# Patient Record
Sex: Male | Born: 1995 | Race: Black or African American | Hispanic: No | Marital: Married | Smoking: Never smoker
Health system: Southern US, Community
[De-identification: ages and names within clinical notes are randomized; demographics above are authoritative.]

## PROBLEM LIST (undated history)

## (undated) DIAGNOSIS — J45909 Unspecified asthma, uncomplicated: Secondary | ICD-10-CM

## (undated) HISTORY — PX: TESTICLE SURGERY: SHX794

## (undated) HISTORY — DX: Morbid (severe) obesity due to excess calories: E66.01

---

## 2020-01-17 ENCOUNTER — Other Ambulatory Visit: Payer: Self-pay

## 2020-01-17 ENCOUNTER — Encounter: Payer: Self-pay | Admitting: Emergency Medicine

## 2020-01-17 ENCOUNTER — Emergency Department
Admission: EM | Admit: 2020-01-17 | Discharge: 2020-01-17 | Disposition: A | Discharge status: Home / Self Care | Payer: Medicaid Other | Attending: Emergency Medicine | Admitting: Emergency Medicine

## 2020-01-17 DIAGNOSIS — R300 Dysuria: Secondary | ICD-10-CM | POA: Diagnosis not present

## 2020-01-17 DIAGNOSIS — Z202 Contact with and (suspected) exposure to infections with a predominantly sexual mode of transmission: Secondary | ICD-10-CM | POA: Diagnosis present

## 2020-01-17 LAB — URINALYSIS, COMPLETE (UACMP) WITH MICROSCOPIC
Bacteria, UA: NONE SEEN
Bilirubin Urine: NEGATIVE
Glucose, UA: NEGATIVE mg/dL
Hgb urine dipstick: NEGATIVE
Ketones, ur: NEGATIVE mg/dL
Leukocytes,Ua: NEGATIVE
Nitrite: NEGATIVE
Protein, ur: NEGATIVE mg/dL
Specific Gravity, Urine: 1.024 (ref 1.005–1.030)
Squamous Epithelial / HPF: NONE SEEN (ref 0–5)
pH: 6 (ref 5.0–8.0)

## 2020-01-17 LAB — CHLAMYDIA/NGC RT PCR (ARMC ONLY)
Chlamydia Tr: NOT DETECTED
N gonorrhoeae: DETECTED — AB

## 2020-01-17 MED ORDER — LIDOCAINE HCL (PF) 1 % IJ SOLN
1.2000 mL | Freq: Once | INTRAMUSCULAR | Status: AC
  Administered 2020-01-17: 1.2 mL
  Filled 2020-01-17: qty 5

## 2020-01-17 MED ORDER — CEFTRIAXONE SODIUM 1 G IJ SOLR
500.0000 mg | Freq: Once | INTRAMUSCULAR | Status: AC
  Administered 2020-01-17: 500 mg via INTRAMUSCULAR
  Filled 2020-01-17: qty 10

## 2020-01-17 MED ORDER — AZITHROMYCIN 500 MG PO TABS
1000.0000 mg | ORAL_TABLET | Freq: Every day | ORAL | Status: DC
  Administered 2020-01-17: 1000 mg via ORAL
  Filled 2020-01-17: qty 2

## 2020-01-17 NOTE — ED Notes (Signed)
See triage note  Presents with burning with urination  States sxs' started couple of weeks ago  No discharge

## 2020-01-17 NOTE — ED Triage Notes (Signed)
Here for burning in urethra.  Possible std exposure.  No discharge. Sx X 2-3 weeks.

## 2020-01-17 NOTE — ED Provider Notes (Signed)
[facility] Emergency Department Provider Note  ____________________________________________  Time seen: Approximately 2:55 PM  I have reviewed the triage vital signs and the nursing notes.   HISTORY  Chief Complaint SEXUALLY TRANSMITTED DISEASE    HPI Deyvi Frangos is a 24 y.o. male after STD exposure.  He is having some burning in the urethra and with urination.  Symptoms started a couple weeks ago.  He denies fever or abdominal pain.   History reviewed. No pertinent past medical history.  There are no problems to display for this patient.   History reviewed. No pertinent surgical history.  Prior to Admission medications   Not on File    Allergies Patient has no known allergies.  History reviewed. No pertinent family history.  Social History Social History   Tobacco Use  . Smoking status: Never Smoker  . Smokeless tobacco: Never Used  Substance Use Topics  . Alcohol use: Never  . Drug use: Never    Review of Systems Constitutional: Negative for fever. Respiratory: Negative for shortness of breath or cough. Gastrointestinal: Negative for abdominal pain; negative for nausea , negative for vomiting. Genitourinary: Positive for dysuria , negative for penile discharge. Musculoskeletal: Negative for back pain. Skin: Negative for acute skin changes/rash/lesion. ____________________________________________   PHYSICAL EXAM:  VITAL SIGNS: ED Triage Vitals  Enc Vitals Group     BP 01/17/20 1245 135/74     Pulse Rate 01/17/20 1245 71     Resp 01/17/20 1245 16     Temp 01/17/20 1245 98.9 F (37.2 C)     Temp Source 01/17/20 1245 Oral     SpO2 01/17/20 1245 97 %     Weight 01/17/20 1239 230 lb (104.3 kg)     Height 01/17/20 1239 5\' 7"  (1.702 m)     Head Circumference --      Peak Flow --      Pain Score 01/17/20 1238 10     Pain Loc --      Pain Edu? --      Excl. in GC? --     Constitutional: Alert and oriented. Well  appearing and in no acute distress. Eyes: Conjunctivae are normal. Head: Atraumatic. Nose: No congestion/rhinnorhea. Mouth/Throat: Mucous membranes are moist. Respiratory: Normal respiratory effort.  No retractions. Gastrointestinal: Bowel sounds active x 4; Abdomen is soft without rebound or guarding. Genitourinary: Visual exam deferred Musculoskeletal: No extremity tenderness nor edema.  Neurologic:  Normal speech and language. No gross focal neurologic deficits are appreciated. Speech is normal. No gait instability. Skin:  Skin is warm, dry and intact. No rash noted on exposed skin. Psychiatric: Mood and affect are normal. Speech and behavior are normal.  ____________________________________________   LABS (all labs ordered are listed, but only abnormal results are displayed)  Labs Reviewed  CHLAMYDIA/NGC RT PCR ([facility] ONLY) - Abnormal; Notable for the following components:      Result Value   N gonorrhoeae DETECTED (*)    All other components within normal limits  URINALYSIS, COMPLETE (UACMP) WITH MICROSCOPIC - Abnormal; Notable for the following components:   Color, Urine YELLOW (*)    APPearance CLEAR (*)    All other components within normal limits   ____________________________________________  RADIOLOGY  Not indicated ____________________________________________  Procedures  ____________________________________________  24 year old male presenting to the emergency department for treatment of STD.  He is unsure which STD he has been in contact with, however he states that he has had STD in the past and has same symptoms  today.  See HPI for further details.  Urinalysis is overall reassuring with the exception of some white blood cells.  Chlamydia and gonorrhea testing is pending.  He will be treated empirically with Rocephin and azithromycin.  He will be discharged home and advised to follow-up with the [place] for further or repeat  STD testing.  He was advised to avoid intercourse for the next week.  He is to return to the emergency department for symptoms of change or worsen if unable to schedule appointment.  INITIAL IMPRESSION / ASSESSMENT AND PLAN / ED COURSE  Pertinent labs & imaging results that were available during my care of the patient were reviewed by me and considered in my medical decision making (see chart for details).  ____________________________________________   FINAL CLINICAL IMPRESSION(S) / ED DIAGNOSES  Final diagnoses:  STD exposure    Note:  This document was prepared using Dragon voice recognition software and may include unintentional dictation errors.   Victorino Dike, FNP 01/17/20 1557    Harvest Dark, MD 01/18/20 1156

## 2020-01-20 ENCOUNTER — Telehealth: Payer: Self-pay | Admitting: Emergency Medicine

## 2020-01-20 NOTE — Telephone Encounter (Signed)
Called patient to inform of std test results.  He was treated during ED visit.  No answer. No voicemail.

## 2020-07-31 ENCOUNTER — Encounter: Payer: Self-pay | Admitting: *Deleted

## 2020-07-31 ENCOUNTER — Emergency Department
Admission: EM | Admit: 2020-07-31 | Discharge: 2020-07-31 | Disposition: A | Discharge status: Home / Self Care | Payer: Medicaid Other | Attending: Emergency Medicine | Admitting: Emergency Medicine

## 2020-07-31 ENCOUNTER — Other Ambulatory Visit: Payer: Self-pay

## 2020-07-31 DIAGNOSIS — H9203 Otalgia, bilateral: Secondary | ICD-10-CM | POA: Diagnosis not present

## 2020-07-31 DIAGNOSIS — Z202 Contact with and (suspected) exposure to infections with a predominantly sexual mode of transmission: Secondary | ICD-10-CM | POA: Diagnosis not present

## 2020-07-31 LAB — URINALYSIS, COMPLETE (UACMP) WITH MICROSCOPIC
Bacteria, UA: NONE SEEN
Bilirubin Urine: NEGATIVE
Glucose, UA: NEGATIVE mg/dL
Hgb urine dipstick: NEGATIVE
Ketones, ur: NEGATIVE mg/dL
Nitrite: NEGATIVE
Protein, ur: NEGATIVE mg/dL
Specific Gravity, Urine: 1.028 (ref 1.005–1.030)
WBC, UA: >50 WBC/hpf — ABNORMAL HIGH (ref 0–5)
pH: 6 (ref 5.0–8.0)

## 2020-07-31 MED ORDER — CETIRIZINE HCL 10 MG PO TABS
10.0000 mg | ORAL_TABLET | Freq: Every day | ORAL | 2 refills | Status: DC
Start: 2020-07-31 — End: 2020-10-31

## 2020-07-31 MED ORDER — DOXYCYCLINE HYCLATE 100 MG PO TBEC: 100.0000 mg | DELAYED_RELEASE_TABLET | Freq: Two times a day (BID) | ORAL | 0 refills | Status: AC

## 2020-07-31 MED ORDER — CEFTRIAXONE SODIUM 1 G IJ SOLR
500.0000 mg | Freq: Once | INTRAMUSCULAR | Status: AC
  Administered 2020-07-31: 500 mg via INTRAMUSCULAR
  Filled 2020-07-31: qty 10

## 2020-07-31 MED ORDER — LIDOCAINE HCL (PF) 1 % IJ SOLN
2.0000 mL | Freq: Once | INTRAMUSCULAR | Status: AC
  Administered 2020-07-31: 2 mL
  Filled 2020-07-31: qty 5

## 2020-07-31 MED ORDER — METRONIDAZOLE 500 MG PO TABS
2000.0000 mg | ORAL_TABLET | Freq: Once | ORAL | Status: AC
  Administered 2020-07-31: 2000 mg via ORAL
  Filled 2020-07-31: qty 4

## 2020-07-31 MED ORDER — FLUTICASONE PROPIONATE 50 MCG/ACT NA SUSP
1.0000 | Freq: Every day | NASAL | 2 refills | Status: DC
Start: 2020-07-31 — End: 2020-10-31

## 2020-07-31 NOTE — ED Provider Notes (Signed)
Emergency Department Provider Note  ____________________________________________  Time seen: Approximately 10:55 PM  I have reviewed the triage vital signs and the nursing notes.   HISTORY  Chief Complaint Otalgia and Dysuria   Historian Patient     HPI Jose Rosales is a 24 y.o. male presents to the emergency department with bilateral ear pain and concern for possible STDs.  Patient states that he does not have dysuria but he has a sensation of pain when he stops urinating.  He denies penile discharge or rash.  States that he is sexually active and has unprotected sex with only 1 person.  Denies fever and chills.  No associated rhinorrhea, nasal congestion or nonproductive cough.  Denies low back pain, nausea or vomiting.   No past medical history on file.   Immunizations up to date:  Yes.     No past medical history on file.  There are no problems to display for this patient.   No past surgical history on file.  Prior to Admission medications   Medication Sig Start Date End Date Taking? Authorizing Provider  cetirizine (ZYRTEC ALLERGY) 10 MG tablet Take 1 tablet (10 mg total) by mouth daily. 07/31/20 07/31/21  Orvil Feil, PA-C  doxycycline (DORYX) 100 MG EC tablet Take 1 tablet (100 mg total) by mouth 2 (two) times daily for 7 days. 07/31/20 08/07/20  Orvil Feil, PA-C  fluticasone (FLONASE) 50 MCG/ACT nasal spray Place 1 spray into both nostrils daily. 07/31/20 07/31/21  Orvil Feil, PA-C    Allergies Patient has no known allergies.  No family history on file.  Social History Social History   Tobacco Use  . Smoking status: Never Smoker  . Smokeless tobacco: Never Used  Substance Use Topics  . Alcohol use: Never  . Drug use: Never     Review of Systems  Constitutional: No fever/chills Eyes:  No discharge ENT: Patient has bilateral ear pain.  Respiratory: no cough. No SOB/ use of accessory muscles to breath Gastrointestinal:   No nausea,  no vomiting.  No diarrhea.  No constipation. Musculoskeletal: Negative for musculoskeletal pain. Skin: Negative for rash, abrasions, lacerations, ecchymosis.    ____________________________________________   PHYSICAL EXAM:  VITAL SIGNS: ED Triage Vitals  Enc Vitals Group     BP 07/31/20 2127 140/73     Pulse Rate 07/31/20 2127 79     Resp 07/31/20 2127 18     Temp 07/31/20 2127 99.1 F (37.3 C)     Temp Source 07/31/20 2127 Oral     SpO2 07/31/20 2127 96 %     Weight 07/31/20 2123 230 lb (104.3 kg)     Height 07/31/20 2123 5\' 10"  (1.778 m)     Head Circumference --      Peak Flow --      Pain Score 07/31/20 2123 9     Pain Loc --      Pain Edu? --      Excl. in GC? --      Constitutional: Alert and oriented. Well appearing and in no acute distress. Eyes: Conjunctivae are normal. PERRL. EOMI. Head: Atraumatic. ENT:      Ears: TMs are effused bilaterally.      Nose: No congestion/rhinnorhea.      Mouth/Throat: Mucous membranes are moist.  Neck: No stridor.  No cervical spine tenderness to palpation. Cardiovascular: Normal rate, regular rhythm. Normal S1 and S2.  Good peripheral circulation. Respiratory: Normal respiratory effort without tachypnea or retractions. Lungs CTAB. Good  air entry to the bases with no decreased or absent breath sounds Gastrointestinal: Bowel sounds x 4 quadrants. Soft and nontender to palpation. No guarding or rigidity. No distention. No CVA tenderness.  Musculoskeletal: Full range of motion to all extremities. No obvious deformities noted Neurologic:  Normal for age. No gross focal neurologic deficits are appreciated.  Skin:  Skin is warm, dry and intact. No rash noted. Psychiatric: Mood and affect are normal for age. Speech and behavior are normal.   ____________________________________________   LABS (all labs ordered are listed, but only abnormal results are displayed)  Labs Reviewed  URINALYSIS, COMPLETE (UACMP) WITH MICROSCOPIC -  Abnormal; Notable for the following components:      Result Value   Color, Urine YELLOW (*)    APPearance CLEAR (*)    Leukocytes,Ua SMALL (*)    WBC, UA >50 (*)    All other components within normal limits  CHLAMYDIA/NGC RT PCR ([facility] ONLY)   ____________________________________________  EKG   ____________________________________________  RADIOLOGY   No results found.  ____________________________________________    PROCEDURES  Procedure(s) performed:     Procedures     Medications  cefTRIAXone (ROCEPHIN) injection 500 mg (500 mg Intramuscular Given 07/31/20 2241)  metroNIDAZOLE (FLAGYL) tablet 2,000 mg (2,000 mg Oral Given 07/31/20 2242)  lidocaine (PF) (XYLOCAINE) 1 % injection 2 mL (2 mLs Other Given 07/31/20 2242)     ____________________________________________   INITIAL IMPRESSION / ASSESSMENT AND PLAN / ED COURSE  Pertinent labs & imaging results that were available during my care of the patient were reviewed by me and considered in my medical decision making (see chart for details).      Assessment and plan Ear pain STD exposure 24 year old male presents to the emergency department with bilateral ear pain and concern for STD exposure  Vital signs are reassuring at triage.  On physical exam, patient had a middle ear effusion bilaterally.  He had no suprapubic tenderness or CVA tenderness on exam.  We will treat with Flonase and Zyrtec for likely eustachian tube dysfunction.  Patient requested to be empirically treated for gonorrhea, chlamydia and trichomoniasis.  He received Rocephin and Flagyl in the emergency department.  He was discharged with doxycycline.  Return precautions were given to return with new or worsening symptoms.    ____________________________________________  FINAL CLINICAL IMPRESSION(S) / ED DIAGNOSES  Final diagnoses:  STD exposure  Otalgia of both ears      NEW MEDICATIONS STARTED DURING THIS VISIT:  ED  Discharge Orders         Ordered    fluticasone (FLONASE) 50 MCG/ACT nasal spray  Daily        07/31/20 2246    cetirizine (ZYRTEC ALLERGY) 10 MG tablet  Daily        07/31/20 2246    doxycycline (DORYX) 100 MG EC tablet  2 times daily        07/31/20 2246              This chart was dictated using voice recognition software/Dragon. Despite best efforts to proofread, errors can occur which can change the meaning. Any change was purely unintentional.     Gasper Lloyd 07/31/20 2300    Chesley Noon, MD 07/31/20 2336

## 2020-07-31 NOTE — ED Triage Notes (Signed)
Pt has bilateral earache and dysuria.  Sx for 6 days. No penile discharge.  Pt alert.

## 2020-07-31 NOTE — Discharge Instructions (Signed)
Take Flonase and Zyrtec for eustachian tube dysfunction. Take doxycycline twice daily for the next 7 days. Please abstain from unprotected sex for the next 2 weeks.

## 2020-07-31 NOTE — ED Notes (Signed)
Pt states ear pain began approx 1 week ago on the right side and has now progressed to the left. Pain described and an uncomfortable pressure. He is worried that a "red fly" may have crawled into his right ear just before Sx began. Urinary Sx began 3 days ago and include irritation with urination. Pt is sexually active but denies any discharge. Pt denies F/C, NVD.

## 2020-07-31 NOTE — ED Notes (Signed)
Pt unable to void at this time. 

## 2020-08-01 LAB — CHLAMYDIA/NGC RT PCR (ARMC ONLY)
Chlamydia Tr: DETECTED — AB
N gonorrhoeae: DETECTED — AB

## 2020-08-02 ENCOUNTER — Telehealth: Payer: Self-pay | Admitting: Emergency Medicine

## 2020-08-02 NOTE — Telephone Encounter (Signed)
Called patient to inform of std test results.  Left message asking him to call me.

## 2020-08-03 NOTE — Telephone Encounter (Signed)
Called patient again to notify of std results.  No answer.  Will send letter.

## 2020-08-23 DIAGNOSIS — U071 COVID-19: Secondary | ICD-10-CM | POA: Insufficient documentation

## 2020-08-23 HISTORY — DX: COVID-19: U07.1

## 2020-09-14 ENCOUNTER — Emergency Department: Payer: Medicaid Other

## 2020-09-14 ENCOUNTER — Other Ambulatory Visit: Payer: Self-pay

## 2020-09-14 ENCOUNTER — Emergency Department
Admission: EM | Admit: 2020-09-14 | Discharge: 2020-09-14 | Disposition: A | Discharge status: Home / Self Care | Payer: Medicaid Other | Attending: Emergency Medicine | Admitting: Emergency Medicine

## 2020-09-14 ENCOUNTER — Encounter: Payer: Self-pay | Admitting: Emergency Medicine

## 2020-09-14 DIAGNOSIS — R519 Headache, unspecified: Secondary | ICD-10-CM

## 2020-09-14 DIAGNOSIS — U071 COVID-19: Secondary | ICD-10-CM | POA: Insufficient documentation

## 2020-09-14 DIAGNOSIS — R42 Dizziness and giddiness: Secondary | ICD-10-CM | POA: Insufficient documentation

## 2020-09-14 DIAGNOSIS — R0981 Nasal congestion: Secondary | ICD-10-CM | POA: Diagnosis not present

## 2020-09-14 LAB — RESP PANEL BY RT-PCR (FLU A&B, COVID) ARPGX2
Influenza A by PCR: NEGATIVE
Influenza B by PCR: NEGATIVE
SARS Coronavirus 2 by RT PCR: POSITIVE — AB

## 2020-09-14 MED ORDER — ONDANSETRON HCL 8 MG PO TABS
8.0000 mg | ORAL_TABLET | Freq: Three times a day (TID) | ORAL | 0 refills | Status: DC | PRN
Start: 2020-09-14 — End: 2020-10-31

## 2020-09-14 MED ORDER — PSEUDOEPH-BROMPHEN-DM 30-2-10 MG/5ML PO SYRP
5.0000 mL | ORAL_SOLUTION | Freq: Four times a day (QID) | ORAL | 0 refills | Status: DC | PRN
Start: 2020-09-14 — End: 2020-10-31

## 2020-09-14 MED ORDER — PSEUDOEPH-BROMPHEN-DM 30-2-10 MG/5ML PO SYRP
5.0000 mL | ORAL_SOLUTION | Freq: Four times a day (QID) | ORAL | 0 refills | Status: DC | PRN
Start: 2020-09-14 — End: 2020-09-14

## 2020-09-14 MED ORDER — IBUPROFEN 600 MG PO TABS: 600.0000 mg | ORAL_TABLET | Freq: Three times a day (TID) | ORAL | 0 refills | Status: DC | PRN

## 2020-09-14 MED ORDER — ONDANSETRON HCL 8 MG PO TABS
8.0000 mg | ORAL_TABLET | Freq: Three times a day (TID) | ORAL | 0 refills | Status: DC | PRN
Start: 2020-09-14 — End: 2020-09-14

## 2020-09-14 NOTE — ED Provider Notes (Signed)
[facility] Emergency Department Provider Note   ____________________________________________   Event Date/Time   First MD Initiated Contact with Patient 09/14/20 1136     (approximate)  I have reviewed the triage vital signs and the nursing notes.   HISTORY  Chief Complaint Headache, Fever, and Generalized Body Aches    HPI Jose Rosales is a 24 y.o. male patient presents with frontal headache, mild vertigo, fever, and body aches for 3 days.  Patient states worst headache ever head is not being relieved with ibuprofen or Tylenol.  Patient denies recent travel or known contact with COVID-19.  Patient not taken Covid vaccine or flu shot for the season.  Patient family members here with similar complaints.         History reviewed. No pertinent past medical history.  There are no problems to display for this patient.   History reviewed. No pertinent surgical history.  Prior to Admission medications   Medication Sig Start Date End Date Taking? Authorizing Provider  brompheniramine-pseudoephedrine-DM 30-2-10 MG/5ML syrup Take 5 mLs by mouth 4 (four) times daily as needed. 09/14/20   Joni Reining, PA-C  cetirizine (ZYRTEC ALLERGY) 10 MG tablet Take 1 tablet (10 mg total) by mouth daily. 07/31/20 07/31/21  Orvil Feil, PA-C  fluticasone (FLONASE) 50 MCG/ACT nasal spray Place 1 spray into both nostrils daily. 07/31/20 07/31/21  Orvil Feil, PA-C  ibuprofen (ADVIL) 600 MG tablet Take 1 tablet (600 mg total) by mouth every 8 (eight) hours as needed. 09/14/20   Joni Reining, PA-C  ondansetron (ZOFRAN) 8 MG tablet Take 1 tablet (8 mg total) by mouth every 8 (eight) hours as needed for nausea or vomiting. 09/14/20   Joni Reining, PA-C    Allergies Patient has no known allergies.  No family history on file.  Social History Social History   Tobacco Use  . Smoking status: Never Smoker  . Smokeless tobacco: Never Used  Substance Use  Topics  . Alcohol use: Never  . Drug use: Never    Review of Systems Constitutional: No fever/chills Eyes: No visual changes. ENT: No sore throat. Cardiovascular: Denies chest pain. Respiratory: Denies shortness of breath. Gastrointestinal: No abdominal pain.  No nausea, no vomiting.  No diarrhea.  No constipation. Genitourinary: Negative for dysuria. Musculoskeletal: Negative for back pain. Skin: Negative for rash. Neurological: Positive for headaches, focal weakness or numbness.   ____________________________________________   PHYSICAL EXAM:  VITAL SIGNS: ED Triage Vitals  Enc Vitals Group     BP 09/14/20 0951 129/70     Pulse Rate 09/14/20 0951 93     Resp 09/14/20 0951 20     Temp 09/14/20 0951 99.4 F (37.4 C)     Temp Source 09/14/20 0951 Oral     SpO2 09/14/20 0951 97 %     Weight 09/14/20 0952 235 lb (106.6 kg)     Height 09/14/20 0952 5\' 10"  (1.778 m)     Head Circumference --      Peak Flow --      Pain Score 09/14/20 0952 10     Pain Loc --      Pain Edu? --      Excl. in GC? --     Constitutional: Alert and oriented. Well appearing and in no acute distress. Eyes: Conjunctivae are normal. PERRL. EOMI. Head: Atraumatic. Nose: No congestion/rhinnorhea.  Ethmoid guarding with palpation. Mouth/Throat: Mucous membranes are moist.  Oropharynx non-erythematous.  Postnasal drainage. Neck: No stridor.  Hematological/Lymphatic/Immunilogical: No cervical lymphadenopathy. Cardiovascular: Normal rate, regular rhythm. Grossly normal heart sounds.  Good peripheral circulation. Respiratory: Normal respiratory effort.  No retractions. Lungs CTAB. Gastrointestinal: Soft and nontender. No distention. No abdominal bruits. No CVA tenderness. Neurologic:  Normal speech and language. No gross focal neurologic deficits are appreciated. No gait instability. Skin:  Skin is warm, dry and intact. No rash noted. Psychiatric: Mood and affect are normal. Speech and behavior are  normal.  ____________________________________________   LABS (all labs ordered are listed, but only abnormal results are displayed)  Labs Reviewed  RESP PANEL BY RT-PCR (FLU A&B, COVID) ARPGX2 - Abnormal; Notable for the following components:      Result Value   SARS Coronavirus 2 by RT PCR POSITIVE (*)    All other components within normal limits   ____________________________________________  EKG   ____________________________________________  RADIOLOGY I, Joni Reining, personally viewed and evaluated these images (plain radiographs) as part of my medical decision making, as well as reviewing the written report by the radiologist.  ED MD interpretation:    Official radiology report(s): CT Head Wo Contrast  Result Date: 09/14/2020 CLINICAL DATA:  Cerebral hemorrhage suspected. Additional history provided: Patient reports headache, fever, general body aches, symptoms for 2 days. EXAM: CT HEAD WITHOUT CONTRAST TECHNIQUE: Contiguous axial images were obtained from the base of the skull through the vertex without intravenous contrast. COMPARISON:  No pertinent prior exams available for comparison. FINDINGS: Brain: Cerebral volume is normal. There is no acute intracranial hemorrhage. No demarcated cortical infarct. No extra-axial fluid collection. No evidence of intracranial mass. No midline shift. Vascular: No hyperdense vessel. Skull: Normal. Negative for fracture or focal lesion. Sinuses/Orbits: Visualized orbits show no acute finding. Mild ethmoid sinus mucosal thickening at the imaged levels. IMPRESSION: Unremarkable non-contrast CT appearance of the brain. No evidence of acute intracranial abnormality. Mild ethmoid sinus mucosal thickening. Electronically Signed   By: Jackey Loge DO   On: 09/14/2020 13:50    ____________________________________________   PROCEDURES  Procedure(s) performed (including Critical  Care):  Procedures   ____________________________________________   INITIAL IMPRESSION / ASSESSMENT AND PLAN / ED COURSE  As part of my medical decision making, I reviewed the following data within the electronic MEDICAL RECORD NUMBER         Patient presents with headache, fever, body aches for 2 days.  Patient tested positive for COVID-19 along with siblings and significant other.  Patient given discharge care instruction.  Advised to quarantine for 14 days per [facility] recommendations.  Take medication as directed.     ____________________________________________   FINAL CLINICAL IMPRESSION(S) / ED DIAGNOSES  Final diagnoses:  COVID-19  Sinus congestion  Sinus headache     ED Discharge Orders         Ordered    ibuprofen (ADVIL) 600 MG tablet  Every 8 hours PRN,   Status:  Discontinued        09/14/20 1510    brompheniramine-pseudoephedrine-DM 30-2-10 MG/5ML syrup  4 times daily PRN,   Status:  Discontinued        09/14/20 1510    ondansetron (ZOFRAN) 8 MG tablet  Every 8 hours PRN,   Status:  Discontinued        09/14/20 1510    brompheniramine-pseudoephedrine-DM 30-2-10 MG/5ML syrup  4 times daily PRN        09/14/20 1511    ibuprofen (ADVIL) 600 MG tablet  Every 8 hours PRN        09/14/20 1511  ondansetron (ZOFRAN) 8 MG tablet  Every 8 hours PRN        09/14/20 1511          *Please note:  Desmond Szabo was evaluated in Emergency Department on 09/14/2020 for the symptoms described in the history of present illness. He was evaluated in the context of the global COVID-19 pandemic, which necessitated consideration that the patient might be at risk for infection with the SARS-CoV-2 virus that causes COVID-19. Institutional protocols and algorithms that pertain to the evaluation of patients at risk for COVID-19 are in a state of rapid change based on information released by regulatory bodies including the CDC and federal and state organizations. These policies and  algorithms were followed during the patient's care in the ED.  Some ED evaluations and interventions may be delayed as a result of limited staffing during and the pandemic.*   Note:  This document was prepared using Dragon voice recognition software and may include unintentional dictation errors.    Joni Reining, PA-C 09/14/20 1516    Dionne Bucy, MD 09/14/20 [phone]

## 2020-09-14 NOTE — ED Triage Notes (Signed)
Pt c/o headache, fever and general bodyaches for the past 2 days

## 2020-09-14 NOTE — Discharge Instructions (Addendum)
Your head CT shows sinus congestion.  You tested positive for COVID-19.  Advised self quarantine per CDC recommendation for 10 days.  Follow discharge care instruction take medication as directed.  Advised to take the COVID-19 vaccine after quarantine.

## 2020-09-15 ENCOUNTER — Encounter: Payer: Self-pay | Admitting: Nurse Practitioner

## 2020-09-15 ENCOUNTER — Other Ambulatory Visit: Payer: Self-pay | Admitting: Nurse Practitioner

## 2020-09-15 DIAGNOSIS — U071 COVID-19: Secondary | ICD-10-CM

## 2020-09-15 NOTE — Progress Notes (Signed)
I connected by phone with Jose Rosales on 09/15/2020 at 12:35 PM to discuss the potential use of a new treatment for mild to moderate COVID-19 viral infection in non-hospitalized patients.  This patient is a 23 y.o. male that meets the FDA criteria for Emergency Use Authorization of COVID monoclonal antibody casirivimab/imdevimab, bamlanivimab/etesevimab, or sotrovimab.  Has a (+) direct SARS-CoV-2 viral test result  Has mild or moderate COVID-19   Is NOT hospitalized due to COVID-19  Is within 10 days of symptom onset  Has at least one of the high risk factor(s) for progression to severe COVID-19 and/or hospitalization as defined in EUA.  Specific high risk criteria : BMI > 25   I have spoken and communicated the following to the patient or parent/caregiver regarding COVID monoclonal antibody treatment:  1. FDA has authorized the emergency use for the treatment of mild to moderate COVID-19 in adults and pediatric patients with positive results of direct SARS-CoV-2 viral testing who are 40 years of age and older weighing at least 40 kg, and who are at high risk for progressing to severe COVID-19 and/or hospitalization.  2. The significant known and potential risks and benefits of COVID monoclonal antibody, and the extent to which such potential risks and benefits are unknown.  3. Information on available alternative treatments and the risks and benefits of those alternatives, including clinical trials.  4. Patients treated with COVID monoclonal antibody should continue to self-isolate and use infection control measures (e.g., wear mask, isolate, social distance, avoid sharing personal items, clean and disinfect "high touch" surfaces, and frequent handwashing) according to CDC guidelines.   5. The patient or parent/caregiver has the option to accept or refuse COVID monoclonal antibody treatment.  After reviewing this information with the patient, the patient has agreed to receive one  of the available covid 19 monoclonal antibodies and will be provided an appropriate fact sheet prior to infusion.  Nicolasa Ducking, NP 09/15/2020 12:35 PM

## 2020-09-18 ENCOUNTER — Ambulatory Visit (HOSPITAL_COMMUNITY): Payer: Medicaid Other | Attending: Pulmonary Disease

## 2020-10-04 ENCOUNTER — Encounter: Payer: Self-pay | Admitting: Emergency Medicine

## 2020-10-04 ENCOUNTER — Emergency Department
Admission: EM | Admit: 2020-10-04 | Discharge: 2020-10-04 | Disposition: A | Discharge status: Home / Self Care | Payer: Medicaid Other | Attending: Emergency Medicine | Admitting: Emergency Medicine

## 2020-10-04 ENCOUNTER — Telehealth: Payer: Self-pay | Admitting: Emergency Medicine

## 2020-10-04 ENCOUNTER — Other Ambulatory Visit: Payer: Self-pay

## 2020-10-04 DIAGNOSIS — N342 Other urethritis: Secondary | ICD-10-CM

## 2020-10-04 DIAGNOSIS — Z8616 Personal history of COVID-19: Secondary | ICD-10-CM | POA: Insufficient documentation

## 2020-10-04 DIAGNOSIS — R300 Dysuria: Secondary | ICD-10-CM | POA: Diagnosis present

## 2020-10-04 LAB — URINALYSIS, COMPLETE (UACMP) WITH MICROSCOPIC
Bacteria, UA: NONE SEEN
Bilirubin Urine: NEGATIVE
Glucose, UA: NEGATIVE mg/dL
Hgb urine dipstick: NEGATIVE
Ketones, ur: NEGATIVE mg/dL
Leukocytes,Ua: NEGATIVE
Nitrite: NEGATIVE
Protein, ur: NEGATIVE mg/dL
Specific Gravity, Urine: 1.025 (ref 1.005–1.030)
pH: 5 (ref 5.0–8.0)

## 2020-10-04 LAB — CHLAMYDIA/NGC RT PCR (ARMC ONLY)
Chlamydia Tr: NOT DETECTED
N gonorrhoeae: DETECTED — AB

## 2020-10-04 MED ORDER — CEFTRIAXONE SODIUM 1 G IJ SOLR
500.0000 mg | Freq: Once | INTRAMUSCULAR | Status: AC
  Administered 2020-10-04: 500 mg via INTRAMUSCULAR
  Filled 2020-10-04: qty 10

## 2020-10-04 MED ORDER — LIDOCAINE HCL (PF) 1 % IJ SOLN
INTRAMUSCULAR | Status: AC
  Administered 2020-10-04: 2.1 mL
  Filled 2020-10-04: qty 5

## 2020-10-04 MED ORDER — AZITHROMYCIN 500 MG PO TABS
1000.0000 mg | ORAL_TABLET | Freq: Every day | ORAL | Status: DC
  Administered 2020-10-04: 1000 mg via ORAL
  Filled 2020-10-04: qty 2

## 2020-10-04 NOTE — ED Triage Notes (Signed)
C/O urinary frequency x 1 week.

## 2020-10-04 NOTE — ED Provider Notes (Signed)
[facility] Emergency Department Provider Note  ____________________________________________  Time seen: Approximately 8:14 AM  I have reviewed the triage vital signs and the nursing notes.   HISTORY  Chief Complaint Dysuria    HPI Jose Rosales is a 25 y.o. male presents to the emergency department for treatment and evaluation of an uncomfortable sensation with urination for the past week.  He states that his ex wife had an STD but was treated.  Patient and ex-wife got back together and he has now developed symptoms.  He states that he does not believe that she waited long enough after being treated to have sex with the person that had given her the STD to begin with.  The patient denies abdominal pain, fever, penile discharge.  No alleviating measures attempted prior to arrival.   Past Medical History:  Diagnosis Date  . COVID-19 virus infection 08/2020  . Morbid obesity [facility])     Patient Active Problem List   Diagnosis Date Noted  . Morbid obesity (HCC)   . COVID-19 virus infection 08/2020    History reviewed. No pertinent surgical history.  Prior to Admission medications   Medication Sig Start Date End Date Taking? Authorizing Provider  brompheniramine-pseudoephedrine-DM 30-2-10 MG/5ML syrup Take 5 mLs by mouth 4 (four) times daily as needed. 09/14/20   Joni Reining, PA-C  cetirizine (ZYRTEC ALLERGY) 10 MG tablet Take 1 tablet (10 mg total) by mouth daily. 07/31/20 07/31/21  Orvil Feil, PA-C  fluticasone (FLONASE) 50 MCG/ACT nasal spray Place 1 spray into both nostrils daily. 07/31/20 07/31/21  Orvil Feil, PA-C  ibuprofen (ADVIL) 600 MG tablet Take 1 tablet (600 mg total) by mouth every 8 (eight) hours as needed. 09/14/20   Joni Reining, PA-C  ondansetron (ZOFRAN) 8 MG tablet Take 1 tablet (8 mg total) by mouth every 8 (eight) hours as needed for nausea or vomiting. 09/14/20   Joni Reining, PA-C    Allergies Patient has no known  allergies.  No family history on file.  Social History Social History   Tobacco Use  . Smoking status: Never Smoker  . Smokeless tobacco: Never Used  Substance Use Topics  . Alcohol use: Never  . Drug use: Never    Review of Systems Constitutional: Negative for fever. Respiratory: Negative for shortness of breath or cough. Gastrointestinal: Negative for abdominal pain; negative for nausea , negative for vomiting. Genitourinary: Positive for dysuria , negative for penile discharge. Musculoskeletal: Negative for back pain. Skin: Negative for acute skin changes/rash/lesion. ____________________________________________   PHYSICAL EXAM:  VITAL SIGNS: ED Triage Vitals  Enc Vitals Group     BP 10/04/20 0758 (!) (P) 149/91     Pulse Rate 10/04/20 0758 (P) 60     Resp 10/04/20 0758 (P) 16     Temp 10/04/20 0758 (P) 98.1 F (36.7 C)     Temp Source 10/04/20 0758 (P) Oral     SpO2 10/04/20 0758 (P) 99 %     Weight 10/04/20 0756 235 lb 0.2 oz (106.6 kg)     Height 10/04/20 0756 5\' 10"  (1.778 m)     Head Circumference --      Peak Flow --      Pain Score 10/04/20 0756 0     Pain Loc --      Pain Edu? --      Excl. in GC? --     Constitutional: Alert and oriented. Well appearing and in no acute distress. Eyes: Conjunctivae  are normal. Head: Atraumatic. Nose: No congestion/rhinnorhea. Mouth/Throat: Mucous membranes are moist. Respiratory: Normal respiratory effort.  No retractions. Gastrointestinal: Bowel sounds active x 4; Abdomen is soft without rebound or guarding. Genitourinary: Positive for dysuria.  Visual exam deferred. Musculoskeletal: No extremity tenderness nor edema.  Neurologic:  Normal speech and language. No gross focal neurologic deficits are appreciated. Speech is normal. No gait instability. Skin:  Skin is warm, dry and intact. No rash noted on exposed skin. Psychiatric: Mood and affect are normal. Speech and behavior are  normal.  ____________________________________________   LABS (all labs ordered are listed, but only abnormal results are displayed)  Labs Reviewed  URINALYSIS, COMPLETE (UACMP) WITH MICROSCOPIC - Abnormal; Notable for the following components:      Result Value   Color, Urine YELLOW (*)    APPearance CLEAR (*)    All other components within normal limits  CHLAMYDIA/NGC RT PCR ([facility] ONLY)   ____________________________________________  RADIOLOGY  Not indicated ____________________________________________  Procedures  ____________________________________________  INITIAL IMPRESSION / ASSESSMENT AND PLAN / ED COURSE  25 year old male presenting to the emergency department for treatment and evaluation of STI.  See HPI for further details.  Plan will be to get a urinalysis and send a urine for GC and chlamydia.  If white blood cells are found in the UA, he will be treated with azithromycin and Rocephin.  Discussed results with patient. Option to wait on GC/Chlamydia results before treating or go ahead and treat since he is symptomatic. He chooses to go ahead with treatment. I feel this is reasonable. Rocephin and Azithromycin ordered.  Patient is to follow up with the health department.   Pertinent labs & imaging results that were available during my care of the patient were reviewed by me and considered in my medical decision making (see chart for details).  ____________________________________________   FINAL CLINICAL IMPRESSION(S) / ED DIAGNOSES  Final diagnoses:  Urethritis    Note:  This document was prepared using Dragon voice recognition software and may include unintentional dictation errors.   Chinita Pester, FNP 10/04/20 1610    Sharman Cheek, MD 10/04/20 [phone]

## 2020-10-04 NOTE — Discharge Instructions (Signed)
Your chlamydia and gonorrhea test should be back in 4 hours. You can look on your MyChart account to see the results.  You have been treated for both and will not need additional treatment today. I do recommend that you follow up with the health department in a couple of weeks.  Return to the ER for symptoms that change or worsen if unable to schedule an appointment.

## 2020-10-04 NOTE — Telephone Encounter (Signed)
Called patient to inform of std results.  No answer and voicemail is full.  He was treated during visit.

## 2020-10-13 ENCOUNTER — Other Ambulatory Visit: Payer: Self-pay

## 2020-10-13 ENCOUNTER — Emergency Department
Admission: EM | Admit: 2020-10-13 | Discharge: 2020-10-13 | Disposition: A | Discharge status: Home / Self Care | Payer: Medicaid Other | Attending: Emergency Medicine | Admitting: Emergency Medicine

## 2020-10-13 DIAGNOSIS — Z8616 Personal history of COVID-19: Secondary | ICD-10-CM | POA: Insufficient documentation

## 2020-10-13 DIAGNOSIS — Z202 Contact with and (suspected) exposure to infections with a predominantly sexual mode of transmission: Secondary | ICD-10-CM | POA: Insufficient documentation

## 2020-10-13 MED ORDER — METRONIDAZOLE 500 MG PO TABS
2000.0000 mg | ORAL_TABLET | Freq: Once | ORAL | Status: AC
  Administered 2020-10-13: 2000 mg via ORAL
  Filled 2020-10-13: qty 4

## 2020-10-13 MED ORDER — DOXYCYCLINE HYCLATE 100 MG PO TBEC: 100.0000 mg | DELAYED_RELEASE_TABLET | Freq: Two times a day (BID) | ORAL | 0 refills | Status: AC

## 2020-10-13 MED ORDER — CEFTRIAXONE SODIUM 1 G IJ SOLR
500.0000 mg | Freq: Once | INTRAMUSCULAR | Status: AC
  Administered 2020-10-13: 500 mg via INTRAMUSCULAR
  Filled 2020-10-13: qty 10

## 2020-10-13 NOTE — Discharge Instructions (Signed)
Please abstain from unprotected sex for 2 weeks. Take doxycycline at home twice a day for 1 week.

## 2020-10-13 NOTE — ED Triage Notes (Signed)
Pt states he is here because he thinks he has chlamydia. Pt states has exposure to partner. Denies penile discharge.

## 2020-10-13 NOTE — ED Provider Notes (Signed)
[facility]-EMERGENCY DEPARTMENT  ____________________________________________  Time seen: Approximately 11:00 PM  I have reviewed the triage vital signs and the nursing notes.   HISTORY  Chief Complaint Exposure to STD   Historian Patient     HPI Jose Rosales is a 25 y.o. male presents to the emergency department after patient was exposed to chlamydia.  Patient denies dysuria, penile discharge or impotence.  He is here for empiric STD treatment.   Past Medical History:  Diagnosis Date   COVID-19 virus infection 08/2020   Morbid obesity (HCC)      Immunizations up to date:  Yes.     Past Medical History:  Diagnosis Date   COVID-19 virus infection 08/2020   Morbid obesity [facility])     Patient Active Problem List   Diagnosis Date Noted   Morbid obesity (HCC)    COVID-19 virus infection 08/2020    No past surgical history on file.  Prior to Admission medications   Medication Sig Start Date End Date Taking? Authorizing Provider  doxycycline (DORYX) 100 MG EC tablet Take 1 tablet (100 mg total) by mouth 2 (two) times daily for 7 days. 10/13/20 10/20/20 Yes Pia Mau M, PA-C  brompheniramine-pseudoephedrine-DM 30-2-10 MG/5ML syrup Take 5 mLs by mouth 4 (four) times daily as needed. 09/14/20   Joni Reining, PA-C  cetirizine (ZYRTEC ALLERGY) 10 MG tablet Take 1 tablet (10 mg total) by mouth daily. 07/31/20 07/31/21  Orvil Feil, PA-C  fluticasone (FLONASE) 50 MCG/ACT nasal spray Place 1 spray into both nostrils daily. 07/31/20 07/31/21  Orvil Feil, PA-C  ibuprofen (ADVIL) 600 MG tablet Take 1 tablet (600 mg total) by mouth every 8 (eight) hours as needed. 09/14/20   Joni Reining, PA-C  ondansetron (ZOFRAN) 8 MG tablet Take 1 tablet (8 mg total) by mouth every 8 (eight) hours as needed for nausea or vomiting. 09/14/20   Joni Reining, PA-C    Allergies Patient has no known allergies.  No family history on file.  Social History Social History    Tobacco Use   Smoking status: Never Smoker   Smokeless tobacco: Never Used  Substance Use Topics   Alcohol use: Never   Drug use: Never     Review of Systems  Constitutional: No fever/chills Eyes:  No discharge ENT: No upper respiratory complaints. Respiratory: no cough. No SOB/ use of accessory muscles to breath Gastrointestinal:   No nausea, no vomiting.  No diarrhea.  No constipation. Musculoskeletal: Negative for musculoskeletal pain. Skin: Negative for rash, abrasions, lacerations, ecchymosis.   ____________________________________________   PHYSICAL EXAM:  VITAL SIGNS: ED Triage Vitals  Enc Vitals Group     BP 10/13/20 2119 138/87     Pulse Rate 10/13/20 2119 80     Resp 10/13/20 2119 16     Temp 10/13/20 2119 99 F (37.2 C)     Temp Source 10/13/20 2119 Oral     SpO2 10/13/20 2119 100 %     Weight 10/13/20 2120 220 lb (99.8 kg)     Height 10/13/20 2120 5\' 10"  (1.778 m)     Head Circumference --      Peak Flow --      Pain Score 10/13/20 2120 0     Pain Loc --      Pain Edu? --      Excl. in GC? --      Constitutional: Alert and oriented. Well appearing and in no acute distress. Eyes: Conjunctivae are normal. PERRL. EOMI.  Head: Atraumatic. Cardiovascular: Normal rate, regular rhythm. Normal S1 and S2.  Good peripheral circulation. Respiratory: Normal respiratory effort without tachypnea or retractions. Lungs CTAB. Good air entry to the bases with no decreased or absent breath sounds Gastrointestinal: Bowel sounds x 4 quadrants. Soft and nontender to palpation. No guarding or rigidity. No distention. Musculoskeletal: Full range of motion to all extremities. No obvious deformities noted Neurologic:  Normal for age. No gross focal neurologic deficits are appreciated.  Skin:  Skin is warm, dry and intact. No rash noted. Psychiatric: Mood and affect are normal for age. Speech and behavior are normal.   ____________________________________________    LABS (all labs ordered are listed, but only abnormal results are displayed)  Labs Reviewed  CHLAMYDIA/NGC RT PCR ([facility] ONLY)  URINALYSIS, COMPLETE (UACMP) WITH MICROSCOPIC   ____________________________________________  EKG   ____________________________________________  RADIOLOGY   No results found.  ____________________________________________    PROCEDURES  Procedure(s) performed:     Procedures     Medications  cefTRIAXone (ROCEPHIN) injection 500 mg (500 mg Intramuscular Given 10/13/20 2202)  metroNIDAZOLE (FLAGYL) tablet 2,000 mg (2,000 mg Oral Given 10/13/20 2159)     ____________________________________________   INITIAL IMPRESSION / ASSESSMENT AND PLAN / ED COURSE  Pertinent labs & imaging results that were available during my care of the patient were reviewed by me and considered in my medical decision making (see chart for details).      Assessment and plan STD exposure 25 year old male presents to the emergency department after being potentially exposed to chlamydia.  Patient was hypertensive at triage but vital signs otherwise reassuring.  Urinalysis and gonorrhea and Chlamydia testing are in process at this time.  Patient received empiric treatment for gonorrhea, chlamydia and trichomoniasis with Rocephin, azithromycin and doxycycline.  He was advised to abstain from unprotected sex for at least 7 days.  Return precautions were given to return with new or worsening symptoms.   ____________________________________________  FINAL CLINICAL IMPRESSION(S) / ED DIAGNOSES  Final diagnoses:  STD exposure      NEW MEDICATIONS STARTED DURING THIS VISIT:  ED Discharge Orders         Ordered    doxycycline (DORYX) 100 MG EC tablet  2 times daily        10/13/20 2211              This chart was dictated using voice recognition software/Dragon. Despite best efforts to proofread, errors can occur which can change the meaning.  Any change was purely unintentional.     Gasper Lloyd 10/13/20 2302    Jene Every, MD 10/13/20 2303

## 2020-10-13 NOTE — ED Notes (Signed)
NON-CLEAN CATCH/DIRTY URINE SAMPLE SENT TO LAB.

## 2020-10-31 ENCOUNTER — Emergency Department
Admission: EM | Admit: 2020-10-31 | Discharge: 2020-10-31 | Disposition: A | Discharge status: Home / Self Care | Payer: Medicaid Other | Attending: Emergency Medicine | Admitting: Emergency Medicine

## 2020-10-31 ENCOUNTER — Other Ambulatory Visit: Payer: Self-pay

## 2020-10-31 ENCOUNTER — Encounter: Payer: Self-pay | Admitting: Emergency Medicine

## 2020-10-31 DIAGNOSIS — R300 Dysuria: Secondary | ICD-10-CM | POA: Diagnosis not present

## 2020-10-31 DIAGNOSIS — Z8616 Personal history of COVID-19: Secondary | ICD-10-CM | POA: Diagnosis not present

## 2020-10-31 DIAGNOSIS — Z202 Contact with and (suspected) exposure to infections with a predominantly sexual mode of transmission: Secondary | ICD-10-CM | POA: Insufficient documentation

## 2020-10-31 LAB — CHLAMYDIA/NGC RT PCR (ARMC ONLY)
Chlamydia Tr: NOT DETECTED
N gonorrhoeae: NOT DETECTED

## 2020-10-31 MED ORDER — CEFTRIAXONE SODIUM 1 G IJ SOLR
500.0000 mg | Freq: Once | INTRAMUSCULAR | Status: AC
  Administered 2020-10-31: 500 mg via INTRAMUSCULAR
  Filled 2020-10-31: qty 10

## 2020-10-31 MED ORDER — LIDOCAINE HCL (PF) 1 % IJ SOLN
5.0000 mL | Freq: Once | INTRAMUSCULAR | Status: AC
  Administered 2020-10-31: 5 mL
  Filled 2020-10-31: qty 5

## 2020-10-31 MED ORDER — AZITHROMYCIN 500 MG PO TABS
1000.0000 mg | ORAL_TABLET | Freq: Once | ORAL | Status: AC
  Administered 2020-10-31: 1000 mg via ORAL
  Filled 2020-10-31: qty 2

## 2020-10-31 NOTE — ED Notes (Signed)
Pt left prior to receiving AVS.

## 2020-10-31 NOTE — ED Triage Notes (Signed)
C/O one week history of burning after urination. Believes he may have gonnhorea.  Denies penile discharge.

## 2020-10-31 NOTE — Discharge Instructions (Addendum)
You have been treated for gonorrhea and chlamydia. You should avoid intercourse or sexual contact with all partners until symptoms are resolved. Follow your results in [facility] MyChart. See the [facility] Department for continued symptoms.

## 2020-10-31 NOTE — ED Provider Notes (Signed)
[facility] Emergency Department Provider Note ____________________________________________  Time seen: 1357  I have reviewed the triage vital signs and the nursing notes.  HISTORY  Chief Complaint  SEXUALLY TRANSMITTED DISEASE   HPI Jose Rosales is a 25 y.o. male presents himself to the ED for concern over possible STD exposure.  Patient presents for 1 week history of burning with urination.  He believes he may have been exposed to gonorrhea, during fellacio.  Denies any frank penile discharge at this time.   Past Medical History:  Diagnosis Date  . COVID-19 virus infection 08/2020  . Morbid obesity [facility])     Patient Active Problem List   Diagnosis Date Noted  . Morbid obesity (HCC)   . COVID-19 virus infection 08/2020    History reviewed. No pertinent surgical history.  Prior to Admission medications   Not on File    Allergies Patient has no known allergies.  History reviewed. No pertinent family history.  Social History Social History   Tobacco Use  . Smoking status: Never Smoker  . Smokeless tobacco: Never Used  Substance Use Topics  . Alcohol use: Never  . Drug use: Never    Review of Systems  Constitutional: Negative for fever. Eyes: Negative for visual changes. ENT: Negative for sore throat. Cardiovascular: Negative for chest pain. Respiratory: Negative for shortness of breath. Gastrointestinal: Negative for abdominal pain, vomiting and diarrhea. Genitourinary: Positive for dysuria. Musculoskeletal: Negative for back pain. Skin: Negative for rash. Neurological: Negative for headaches, focal weakness or numbness. ____________________________________________  PHYSICAL EXAM:  VITAL SIGNS: ED Triage Vitals  Enc Vitals Group     BP 10/31/20 1318 (!) 153/89     Pulse Rate 10/31/20 1318 72     Resp 10/31/20 1318 16     Temp 10/31/20 1318 98.4 F (36.9 C)     Temp Source 10/31/20 1318 Oral     SpO2 10/31/20 1318 98 %      Weight 10/31/20 1317 220 lb 0.3 oz (99.8 kg)     Height 10/31/20 1317 5\' 10"  (1.778 m)     Head Circumference --      Peak Flow --      Pain Score 10/31/20 1317 0     Pain Loc --      Pain Edu? --      Excl. in GC? --     Constitutional: Alert and oriented. Well appearing and in no distress. Head: Normocephalic and atraumatic. Eyes: Conjunctivae are normal. Normal extraocular movements Cardiovascular: Normal rate, regular rhythm. Normal distal pulses. Respiratory: Normal respiratory effort. No wheezes/rales/rhonchi. GU: deferred Musculoskeletal: Nontender with normal range of motion in all extremities.  Neurologic:  Normal gait without ataxia. Normal speech and language. No gross focal neurologic deficits are appreciated. Skin:  Skin is warm, dry and intact. No rash noted. Psychiatric: Mood and affect are normal. Patient exhibits appropriate insight and judgment. ____________________________________________   LABS (pertinent positives/negatives)  Labs Reviewed  CHLAMYDIA/NGC RT PCR ([facility] ONLY)  URINALYSIS, COMPLETE (UACMP) WITH MICROSCOPIC  ____________________________________________  PROCEDURES  Rocephin 500 mg IM Azithromycin 1000 mg PO  Procedures ____________________________________________  INITIAL IMPRESSION / ASSESSMENT AND PLAN / ED COURSE  DDX: urethritis, gonorrhea/chlamydia  Patient ED evaluation after exposure to gonorrhea after sexual encounter.  Patient presents with dysuria but denies any frank penile discharge or lesions.  Patient treated empirically with Rocephin and azithromycin in the ED.  He is discharged to follow-up with primary provider or health department for ongoing symptoms.  He  will follow his results and [facility] MyChart.   Jose Rosales was evaluated in Emergency Department on 10/31/2020 for the symptoms described in the history of present illness. He was evaluated in the context of the global COVID-19 pandemic, which  necessitated consideration that the patient might be at risk for infection with the SARS-CoV-2 virus that causes COVID-19. Institutional protocols and algorithms that pertain to the evaluation of patients at risk for COVID-19 are in a state of rapid change based on information released by regulatory bodies including the CDC and federal and state organizations. These policies and algorithms were followed during the patient's care in the ED. ____________________________________________  FINAL CLINICAL IMPRESSION(S) / ED DIAGNOSES  Final diagnoses:  Exposure to STD      Marlita Keil, Charlesetta Ivory, PA-C 10/31/20 1536    Chesley Noon, MD 11/01/20 [phone]

## 2020-10-31 NOTE — ED Notes (Signed)
See triage note  Presents with burning with urination w/o discharge  Thinks he may have GC

## 2020-11-19 ENCOUNTER — Emergency Department
Admission: EM | Admit: 2020-11-19 | Discharge: 2020-11-19 | Disposition: A | Discharge status: Home / Self Care | Payer: Medicaid Other | Attending: Emergency Medicine | Admitting: Emergency Medicine

## 2020-11-19 ENCOUNTER — Other Ambulatory Visit: Payer: Self-pay

## 2020-11-19 ENCOUNTER — Encounter: Payer: Self-pay | Admitting: *Deleted

## 2020-11-19 DIAGNOSIS — J45909 Unspecified asthma, uncomplicated: Secondary | ICD-10-CM | POA: Insufficient documentation

## 2020-11-19 DIAGNOSIS — Z202 Contact with and (suspected) exposure to infections with a predominantly sexual mode of transmission: Secondary | ICD-10-CM | POA: Insufficient documentation

## 2020-11-19 HISTORY — DX: Unspecified asthma, uncomplicated: J45.909

## 2020-11-19 LAB — URINALYSIS, COMPLETE (UACMP) WITH MICROSCOPIC
Bacteria, UA: NONE SEEN
Bilirubin Urine: NEGATIVE
Glucose, UA: NEGATIVE mg/dL
Hgb urine dipstick: NEGATIVE
Ketones, ur: NEGATIVE mg/dL
Leukocytes,Ua: NEGATIVE
Nitrite: NEGATIVE
Protein, ur: NEGATIVE mg/dL
Specific Gravity, Urine: 1.008 (ref 1.005–1.030)
Squamous Epithelial / HPF: NONE SEEN (ref 0–5)
pH: 6 (ref 5.0–8.0)

## 2020-11-19 LAB — CHLAMYDIA/NGC RT PCR (ARMC ONLY)
Chlamydia Tr: NOT DETECTED
N gonorrhoeae: NOT DETECTED

## 2020-11-19 MED ORDER — DOXYCYCLINE HYCLATE 100 MG PO TABS
100.0000 mg | ORAL_TABLET | Freq: Once | ORAL | Status: AC
  Administered 2020-11-19: 100 mg via ORAL
  Filled 2020-11-19: qty 1

## 2020-11-19 MED ORDER — CEFTRIAXONE SODIUM 1 G IJ SOLR
500.0000 mg | Freq: Once | INTRAMUSCULAR | Status: AC
  Administered 2020-11-19: 500 mg via INTRAMUSCULAR
  Filled 2020-11-19: qty 10

## 2020-11-19 MED ORDER — DOXYCYCLINE HYCLATE 100 MG PO CAPS: 100.0000 mg | ORAL_CAPSULE | Freq: Two times a day (BID) | ORAL | 0 refills | Status: AC

## 2020-11-19 NOTE — Discharge Instructions (Addendum)
Take antibiotics for full 7 days. Abstain from sexual contact until the antibiotics are completed. Follow-up with [place] health department if any further concerns. °

## 2020-11-19 NOTE — ED Provider Notes (Signed)
[facility] Emergency Department Provider Note  ____________________________________________   Event Date/Time   First MD Initiated Contact with Patient 11/19/20 2034     (approximate)  I have reviewed the triage vital signs and the nursing notes.   HISTORY  Chief Complaint Exposure to STD (Chlamydia exposure last week. Pt was called by partner of last week.)   HPI Jose Rosales is a 25 y.o. male who presents to the emergency department for evaluation of possible STD.  Patient states that he and his sexual partner have been passing an STD back-and-forth.  He reports he was last treated a few weeks ago for STD.  He is reporting dysuria, denies urinary frequency, penile discharge or scrotal swelling.  Denies abdominal pain.  No fevers.         Past Medical History:  Diagnosis Date  . Asthma   . COVID-19 virus infection 08/2020  . Morbid obesity [facility])     Patient Active Problem List   Diagnosis Date Noted  . Morbid obesity (HCC)   . COVID-19 virus infection 08/2020    Past Surgical History:  Procedure Laterality Date  . TESTICLE SURGERY      Prior to Admission medications   Medication Sig Start Date End Date Taking? Authorizing Provider  doxycycline (VIBRAMYCIN) 100 MG capsule Take 1 capsule (100 mg total) by mouth 2 (two) times daily for 7 days. 11/19/20 11/26/20 Yes Lucy Chris, PA    Allergies Patient has no known allergies.  No family history on file.  Social History Social History   Tobacco Use  . Smoking status: Never Smoker  . Smokeless tobacco: Never Used  Vaping Use  . Vaping Use: Never used  Substance Use Topics  . Alcohol use: Never  . Drug use: Never    Review of Systems Constitutional: No fever/chills Eyes: No visual changes. ENT: No sore throat. Cardiovascular: Denies chest pain. Respiratory: Denies shortness of breath. Gastrointestinal: No abdominal pain.  No nausea, no vomiting.  No diarrhea.  No  constipation. Genitourinary: + dysuria. Musculoskeletal: Negative for back pain. Skin: Negative for rash. Neurological: Negative for headaches, focal weakness or numbness.  ____________________________________________   PHYSICAL EXAM:  VITAL SIGNS: ED Triage Vitals  Enc Vitals Group     BP 11/19/20 2016 (!) 151/88     Pulse Rate 11/19/20 2016 72     Resp 11/19/20 2016 18     Temp 11/19/20 2016 98.9 F (37.2 C)     Temp Source 11/19/20 2016 Oral     SpO2 11/19/20 2016 97 %     Weight 11/19/20 2017 230 lb (104.3 kg)     Height 11/19/20 2017 5\' 10"  (1.778 m)     Head Circumference --      Peak Flow --      Pain Score 11/19/20 2017 0     Pain Loc --      Pain Edu? --      Excl. in GC? --    Constitutional: Alert and oriented. Well appearing and in no acute distress. Eyes: Conjunctivae are normal. PERRL. EOMI. Head: Atraumatic. Nose: No congestion/rhinnorhea. Mouth/Throat: Mucous membranes are moist.  Oropharynx non-erythematous. Neck: No stridor.   Cardiovascular: Normal rate, regular rhythm. Grossly normal heart sounds.  Good peripheral circulation. Respiratory: Normal respiratory effort.  No retractions. Lungs CTAB. Gastrointestinal: Soft and nontender. No distention. No abdominal bruits. No CVA tenderness. Musculoskeletal: No lower extremity tenderness nor edema.  No joint effusions. Neurologic:  Normal speech and language.  No gross focal neurologic deficits are appreciated. No gait instability. Skin:  Skin is warm, dry and intact. No rash noted. Psychiatric: Mood and affect are normal. Speech and behavior are normal.  ____________________________________________   LABS (all labs ordered are listed, but only abnormal results are displayed)  Labs Reviewed  URINALYSIS, COMPLETE (UACMP) WITH MICROSCOPIC - Abnormal; Notable for the following components:      Result Value   Color, Urine STRAW (*)    APPearance CLEAR (*)    All other components within normal limits   CHLAMYDIA/NGC RT PCR ([facility] ONLY)    ____________________________________________   INITIAL IMPRESSION / ASSESSMENT AND PLAN / ED COURSE  As part of my medical decision making, I reviewed the following data within the electronic MEDICAL RECORD NUMBER Nursing notes reviewed and incorporated, Labs reviewed and Notes from prior ED visits        Patient is a 25 year old male who presents to the emergency department for evaluation of concern for probable STD, stating that he and his partner have been passing it back and forth.  Reports urinary discomfort but states this is what it feels like each time he has got an STD before.  Patient denies any systemic symptoms.  Patient is mildly hypertensive in triage, otherwise vitals are within normal limits.  Physical exam is normal.  Will obtain urinalysis and screen urine for GC and chlamydia.  Urine is negative and reassuring at this time.  Patient would like to be treated and discharged prior to waiting for [facility] and chlamydia result, which I feel is amenable given his review of previous visits to the ER and frequent positive tests.  Patient was treated with IM Rocephin and 7 days of doxycycline and advised to abstain from sexual contact for the duration of the antibiotic.  Patient amenable with this plan is stable this time for outpatient follow-up.      ____________________________________________   FINAL CLINICAL IMPRESSION(S) / ED DIAGNOSES  Final diagnoses:  STD exposure     ED Discharge Orders         Ordered    doxycycline (VIBRAMYCIN) 100 MG capsule  2 times daily        11/19/20 2136          *Please note:  Jose Rosales was evaluated in Emergency Department on 11/19/2020 for the symptoms described in the history of present illness. He was evaluated in the context of the global COVID-19 pandemic, which necessitated consideration that the patient might be at risk for infection with the SARS-CoV-2 virus that causes COVID-19.  Institutional protocols and algorithms that pertain to the evaluation of patients at risk for COVID-19 are in a state of rapid change based on information released by regulatory bodies including the CDC and federal and state organizations. These policies and algorithms were followed during the patient's care in the ED.  Some ED evaluations and interventions may be delayed as a result of limited staffing during and the pandemic.*   Note:  This document was prepared using Dragon voice recognition software and may include unintentional dictation errors.   Lucy Chris, PA 11/19/20 2300    Sharyn Creamer, MD 11/21/20 1807

## 2020-11-19 NOTE — ED Triage Notes (Signed)
Pt possibly exposed last week to chlamydia by partner who tested positive for this STD.

## 2021-01-16 ENCOUNTER — Emergency Department
Admission: EM | Admit: 2021-01-16 | Discharge: 2021-01-16 | Disposition: A | Discharge status: Home / Self Care | Payer: Medicaid Other | Attending: Student in an Organized Health Care Education/Training Program | Admitting: Student in an Organized Health Care Education/Training Program

## 2021-01-16 ENCOUNTER — Other Ambulatory Visit: Payer: Self-pay

## 2021-01-16 ENCOUNTER — Encounter: Payer: Self-pay | Admitting: Emergency Medicine

## 2021-01-16 DIAGNOSIS — Z202 Contact with and (suspected) exposure to infections with a predominantly sexual mode of transmission: Secondary | ICD-10-CM | POA: Diagnosis not present

## 2021-01-16 DIAGNOSIS — Z8616 Personal history of COVID-19: Secondary | ICD-10-CM | POA: Insufficient documentation

## 2021-01-16 DIAGNOSIS — R300 Dysuria: Secondary | ICD-10-CM | POA: Diagnosis present

## 2021-01-16 DIAGNOSIS — N342 Other urethritis: Secondary | ICD-10-CM

## 2021-01-16 DIAGNOSIS — J45909 Unspecified asthma, uncomplicated: Secondary | ICD-10-CM | POA: Diagnosis not present

## 2021-01-16 LAB — URINALYSIS, COMPLETE (UACMP) WITH MICROSCOPIC
Bilirubin Urine: NEGATIVE
Glucose, UA: NEGATIVE mg/dL
Hgb urine dipstick: NEGATIVE
Ketones, ur: NEGATIVE mg/dL
Leukocytes,Ua: NEGATIVE
Nitrite: NEGATIVE
Protein, ur: NEGATIVE mg/dL
Specific Gravity, Urine: 1.024 (ref 1.005–1.030)
pH: 7 (ref 5.0–8.0)

## 2021-01-16 LAB — CHLAMYDIA/NGC RT PCR (ARMC ONLY)
Chlamydia Tr: NOT DETECTED
N gonorrhoeae: NOT DETECTED

## 2021-01-16 LAB — HIV ANTIBODY (ROUTINE TESTING W REFLEX): HIV Screen 4th Generation wRfx: NONREACTIVE

## 2021-01-16 MED ORDER — CEFTRIAXONE SODIUM 250 MG IJ SOLR
250.0000 mg | Freq: Once | INTRAMUSCULAR | Status: AC
  Administered 2021-01-16: 250 mg via INTRAMUSCULAR
  Filled 2021-01-16: qty 250

## 2021-01-16 MED ORDER — AZITHROMYCIN 500 MG PO TABS
2000.0000 mg | ORAL_TABLET | Freq: Once | ORAL | Status: AC
  Administered 2021-01-16: 2000 mg via ORAL
  Filled 2021-01-16: qty 4

## 2021-01-16 MED ORDER — LIDOCAINE HCL (PF) 1 % IJ SOLN
INTRAMUSCULAR | Status: AC
  Administered 2021-01-16: 0.9 mL
  Filled 2021-01-16: qty 5

## 2021-01-16 NOTE — ED Provider Notes (Signed)
[facility] Emergency Department Provider Note    Event Date/Time   First MD Initiated Contact with Patient 01/16/21 [id number]     (approximate)  I have reviewed the triage vital signs and the nursing notes.   HISTORY  Chief Complaint SEXUALLY TRANSMITTED DISEASE    HPI Jose Rosales is a 25 y.o. male who presents to the ER for evaluation of burning dysuria discomfort.  Patient is sexually active.  Concerns that he contracted STD.  Has not noted any purulent discharge.  Has never had this type of symptom before.  No recent antibiotics.  Sexually active with women.  Denies any testicular pain.  No fevers.  No chills  Past Medical History:  Diagnosis Date  . Asthma   . COVID-19 virus infection 08/2020  . Morbid obesity (HCC)    No family history on file. Past Surgical History:  Procedure Laterality Date  . TESTICLE SURGERY     Patient Active Problem List   Diagnosis Date Noted  . Morbid obesity (HCC)   . COVID-19 virus infection 08/2020      Prior to Admission medications   Not on File    Allergies Patient has no known allergies.    Social History Social History   Tobacco Use  . Smoking status: Never Smoker  . Smokeless tobacco: Never Used  Vaping Use  . Vaping Use: Never used  Substance Use Topics  . Alcohol use: Never  . Drug use: Never    Review of Systems Patient denies headaches, rhinorrhea, blurry vision, numbness, shortness of breath, chest pain, edema, cough, abdominal pain, nausea, vomiting, diarrhea, dysuria, fevers, rashes or hallucinations unless otherwise stated above in HPI. ____________________________________________   PHYSICAL EXAM:  VITAL SIGNS: Vitals:   01/16/21 0644  BP: (!) 142/92  Pulse: 72  Resp: 18  Temp: 98.1 F (36.7 C)  SpO2: 98%    Constitutional: Alert and oriented. Well appearing and in no acute distress. Eyes: Conjunctivae are normal.  Head: Atraumatic. Nose: No  congestion/rhinnorhea. Mouth/Throat: Mucous membranes are moist.   Neck: Painless ROM.  Cardiovascular:   Good peripheral circulation. Respiratory: Normal respiratory effort.  No retractions.  Gastrointestinal: Soft and nontender.  GU: normal external genitalia Musculoskeletal: No lower extremity tenderness .  No joint effusions. Neurologic:  Normal speech and language. No gross focal neurologic deficits are appreciated.  Skin:  Skin is warm, dry and intact. No rash noted. Psychiatric: Mood and affect are normal. Speech and behavior are normal.  ____________________________________________   LABS (all labs ordered are listed, but only abnormal results are displayed)  No results found for this or any previous visit (from the past 24 hour(s)). ____________________________________________  EKG____________________________________________  RADIOLOGY   ____________________________________________   PROCEDURES  Procedure(s) performed:  Procedures    Critical Care performed: no ____________________________________________   INITIAL IMPRESSION / ASSESSMENT AND PLAN / ED COURSE  Pertinent labs & imaging results that were available during my care of the patient were reviewed by me and considered in my medical decision making (see chart for details).  DDX: sti, urethritis, cystitis  Jose Rosales is a 25 y.o. who presents to the ED with symptoms of dysuria for the past several days as described above concern for STI.  Patient clinically well-appearing.  Exam is reassuring.  As he states that he is sexually active having unprotected sex will test and empirically treat given his symptoms.  Patient consented to be tested for HIV.  He appears stable and appropriate for outpatient follow-up.  The patient was evaluated in Emergency Department today for the symptoms described in the history of present illness. He/she was evaluated in the context of the global COVID-19 pandemic,  which necessitated consideration that the patient might be at risk for infection with the SARS-CoV-2 virus that causes COVID-19. Institutional protocols and algorithms that pertain to the evaluation of patients at risk for COVID-19 are in a state of rapid change based on information released by regulatory bodies including the CDC and federal and state organizations. These policies and algorithms were followed during the patient's care in the ED.  ____________________________________________   FINAL CLINICAL IMPRESSION(S) / ED DIAGNOSES  Final diagnoses:  Urethritis      NEW MEDICATIONS STARTED DURING THIS VISIT:  New Prescriptions   No medications on file     Note:  This document was prepared using Dragon voice recognition software and may include unintentional dictation errors.     Willy Eddy, MD 01/16/21 [phone]

## 2021-01-16 NOTE — ED Notes (Signed)
Pt observed exiting ED room, RN asked pt where he was going, informed him he has not received his discharge paperwork yet. Pt states "I ain't going nowhere, I'm just getting something out of my car." Pt ambulatory from ED with steady gait, Dr Roxan Hockey aware.

## 2021-01-16 NOTE — ED Triage Notes (Signed)
Patient ambulatory to triage with steady gait, without difficulty or distress noted; pt reports wanting to be seen for possible STD; c/o discomfort with urination; denies any discharge

## 2021-02-08 ENCOUNTER — Emergency Department
Admission: EM | Admit: 2021-02-08 | Discharge: 2021-02-08 | Disposition: A | Discharge status: Home / Self Care | Payer: Medicaid Other | Attending: Emergency Medicine | Admitting: Emergency Medicine

## 2021-02-08 ENCOUNTER — Other Ambulatory Visit: Payer: Self-pay

## 2021-02-08 DIAGNOSIS — N4889 Other specified disorders of penis: Secondary | ICD-10-CM | POA: Diagnosis present

## 2021-02-08 DIAGNOSIS — Z202 Contact with and (suspected) exposure to infections with a predominantly sexual mode of transmission: Secondary | ICD-10-CM | POA: Diagnosis not present

## 2021-02-08 DIAGNOSIS — N342 Other urethritis: Secondary | ICD-10-CM | POA: Diagnosis not present

## 2021-02-08 DIAGNOSIS — Z8616 Personal history of COVID-19: Secondary | ICD-10-CM | POA: Diagnosis not present

## 2021-02-08 DIAGNOSIS — J45909 Unspecified asthma, uncomplicated: Secondary | ICD-10-CM | POA: Diagnosis not present

## 2021-02-08 LAB — CHLAMYDIA/NGC RT PCR (ARMC ONLY)
Chlamydia Tr: NOT DETECTED
N gonorrhoeae: NOT DETECTED

## 2021-02-08 NOTE — ED Notes (Signed)
See triage note  States he was exposed to [facility]   Denies any sx's at presents

## 2021-02-08 NOTE — ED Provider Notes (Addendum)
[facility] Emergency Department Provider Note  ____________________________________________   None    (approximate)  I have reviewed the triage vital signs and the nursing notes.   HISTORY  Chief Complaint Exposure to STD   HPI Jose Rosales is a 25 y.o. male with complaint of penile irritation.  Patient states that he was exposed to gonorrhea but denies any other symptoms at present.       Past Medical History:  Diagnosis Date  . Asthma   . COVID-19 virus infection 08/2020  . Morbid obesity [facility])     Patient Active Problem List   Diagnosis Date Noted  . Morbid obesity (HCC)   . COVID-19 virus infection 08/2020    Past Surgical History:  Procedure Laterality Date  . TESTICLE SURGERY      Prior to Admission medications   Not on File    Allergies Patient has no known allergies.  No family history on file.  Social History Social History   Tobacco Use  . Smoking status: Never Smoker  . Smokeless tobacco: Never Used  Vaping Use  . Vaping Use: Never used  Substance Use Topics  . Alcohol use: Never  . Drug use: Never    Review of Systems Constitutional: No fever/chills Eyes: No visual changes. Cardiovascular: Denies chest pain. Respiratory: Denies shortness of breath. Gastrointestinal: No abdominal pain.  No nausea, no vomiting.  Genitourinary: Penile irritation. Musculoskeletal: Negative for musculoskeletal pain. Skin: Negative for rash. Neurological: Negative for headaches, focal weakness or numbness. ____________________________________________   PHYSICAL EXAM:  VITAL SIGNS: ED Triage Vitals  Enc Vitals Group     BP 02/08/21 0521 121/81     Pulse Rate 02/08/21 0521 88     Resp 02/08/21 0521 20     Temp 02/08/21 0521 98.2 F (36.8 C)     Temp Source 02/08/21 0521 Oral     SpO2 02/08/21 0521 96 %     Weight 02/08/21 0521 240 lb (108.9 kg)     Height 02/08/21 0521 5\' 10"  (1.778 m)     Head Circumference --       Peak Flow --      Pain Score 02/08/21 0530 9     Pain Loc --      Pain Edu? --      Excl. in GC? --     Constitutional: Alert and oriented. Well appearing and in no acute distress. Eyes: Conjunctivae are normal.  Head: Atraumatic. Neck: No stridor.   Cardiovascular: Normal rate, regular rhythm. Grossly normal heart sounds.  Good peripheral circulation. Respiratory: Normal respiratory effort.  No retractions. Lungs CTAB. Musculoskeletal: Moves upper and lower extremities that any difficulty.  Normal gait was noted. Neurologic:  Normal speech and language. No gross focal neurologic deficits are appreciated.  Skin:  Skin is warm, dry and intact. No rash noted. Psychiatric: Mood and affect are normal. Speech and behavior are normal.  ____________________________________________   LABS (all labs ordered are listed, but only abnormal results are displayed)  Labs Reviewed  CHLAMYDIA/NGC RT PCR ([facility] ONLY)    PROCEDURES  Procedure(s) performed (including Critical Care):  Procedures   ____________________________________________   INITIAL IMPRESSION / ASSESSMENT AND PLAN / ED COURSE  As part of my medical decision making, I reviewed the following data within the electronic MEDICAL RECORD NUMBER Notes from prior ED visits and [place] Controlled Substance Database  25 year old male presents to the ED with concerns for an STD.  Patient states that he has some  irritation to his penis but denies any actual penile discharge.  In looking through his records he has been here 4 times in the emergency room and treated all 4 times for STDs however only 1 time in the 4 times he has been treated was he ever positive.  We discussed waiting for the results of his test.  He has access to MyChart and we will watch for the test results.  He was also given information to follow-up with the [facility] department should his test results be positive so he can be treated.  He also was made  aware that he could have his testing and treatment done for free at the health department in the future.  Patient elected to go home rather than wait in the emergency department.  Information about safe sex practices was given to the patient.  ____________________________________________   FINAL CLINICAL IMPRESSION(S) / ED DIAGNOSES  Final diagnoses:  Urethritis     ED Discharge Orders    None      *Please note:  Shantanu Grahn was evaluated in Emergency Department on 02/08/2021 for the symptoms described in the history of present illness. He was evaluated in the context of the global COVID-19 pandemic, which necessitated consideration that the patient might be at risk for infection with the SARS-CoV-2 virus that causes COVID-19. Institutional protocols and algorithms that pertain to the evaluation of patients at risk for COVID-19 are in a state of rapid change based on information released by regulatory bodies including the CDC and federal and state organizations. These policies and algorithms were followed during the patient's care in the ED.  Some ED evaluations and interventions may be delayed as a result of limited staffing during and the pandemic.*   Note:  This document was prepared using Dragon voice recognition software and may include unintentional dictation errors.    Tommi Rumps, PA-C 02/08/21 0906    Tommi Rumps, PA-C 02/08/21 7939    Jene Every, MD 02/08/21 [phone]

## 2021-02-08 NOTE — Discharge Instructions (Addendum)
You may get the results of your test on MyChart.  Information about the [facility] health department STD clinic is given to you and you may contact them for an appointment to be treated if your test results are positive.  Also in the future you can use this resource to make an appointment and be tested and treated along with your partner

## 2021-02-08 NOTE — ED Triage Notes (Signed)
Pt to ED for STD exposure, seen less than a month ago for same. Reports increased urine frequency.   Pt playing on phone in triage, will not make eye contact with nurse, NAD noted  Difficult to assess pt, pt asked x3 to turn down volume of phone so RN could ask triage questions.

## 2022-04-29 IMAGING — CT CT HEAD W/O CM
3 series · 16 of 47 positions shown, 19 images · non-contrast
Comparison: No pertinent prior exams available for comparison.

CLINICAL DATA: Cerebral hemorrhage suspected. Additional history
provided: Patient reports headache, fever, general body aches,
symptoms for 2 days.

EXAM:
CT HEAD WITHOUT CONTRAST
TECHNIQUE: Contiguous axial images were obtained from the base of the skull
through the vertex without intravenous contrast.

[Series 2: head wo · axial · 0.43mm/px · z∈[-113,+17]mm · 10 of 32 slices shown, 13 images]
[im 3/32  brain]
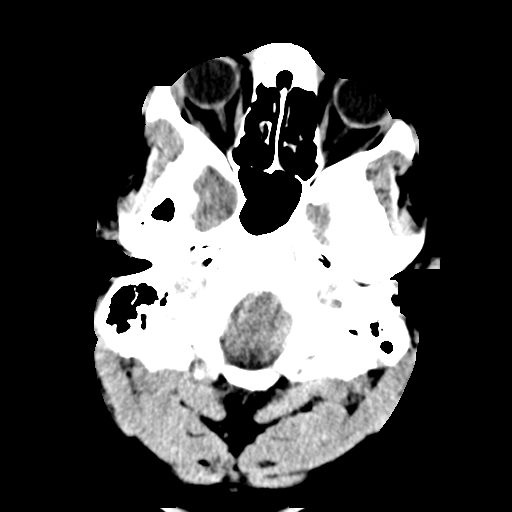
[im 3/32  bone]
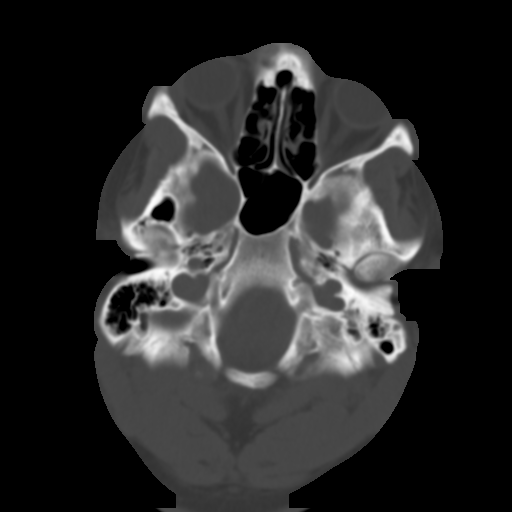
[im 6/32  brain]
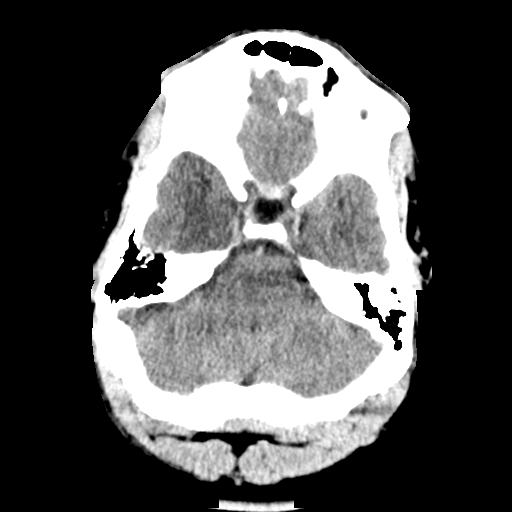
[im 9/32  brain]
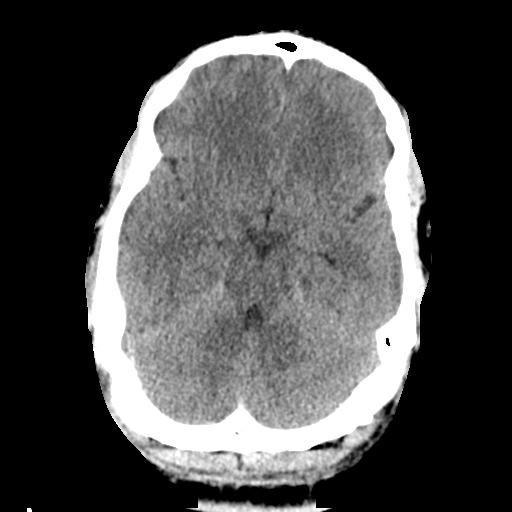
[im 11/32  brain]
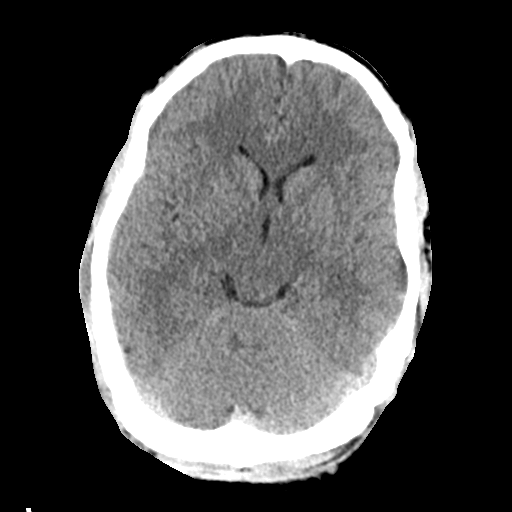
[im 14/32  brain]
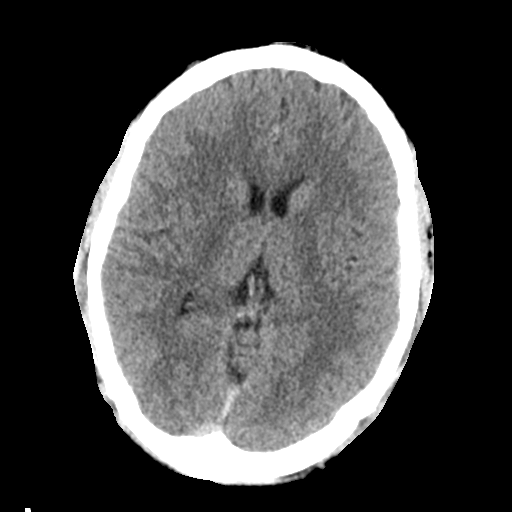
[im 14/32  bone]
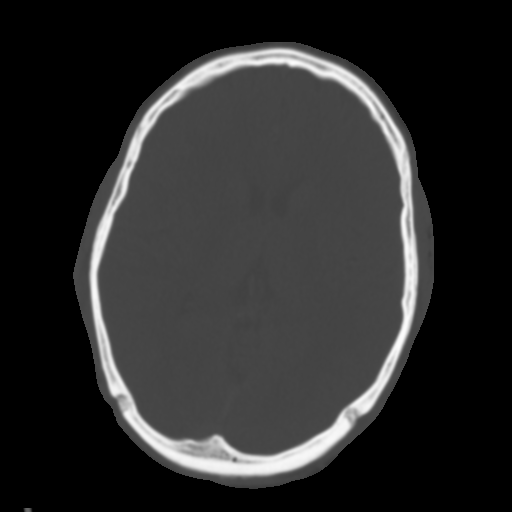
[im 18/32  brain]
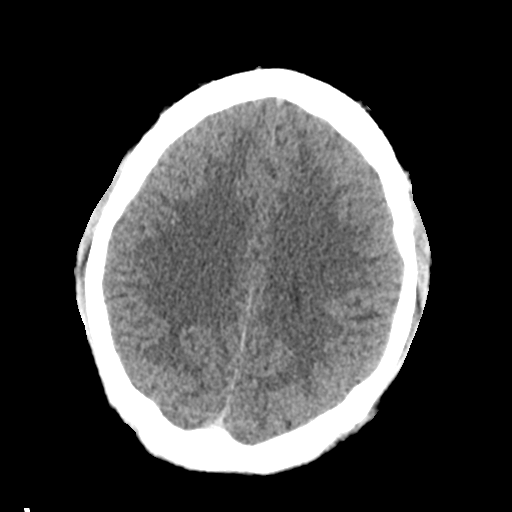
[im 21/32  brain]
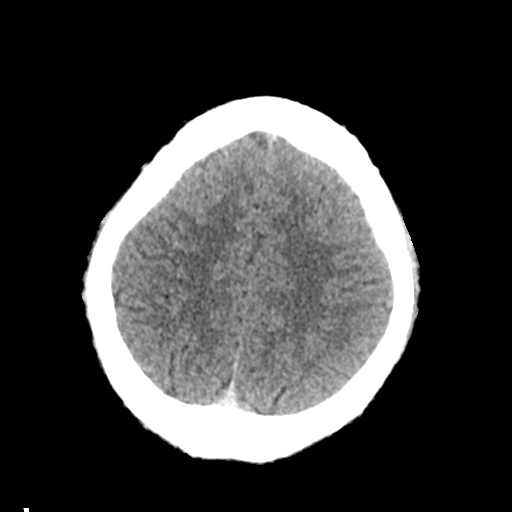
[im 24/32  brain]
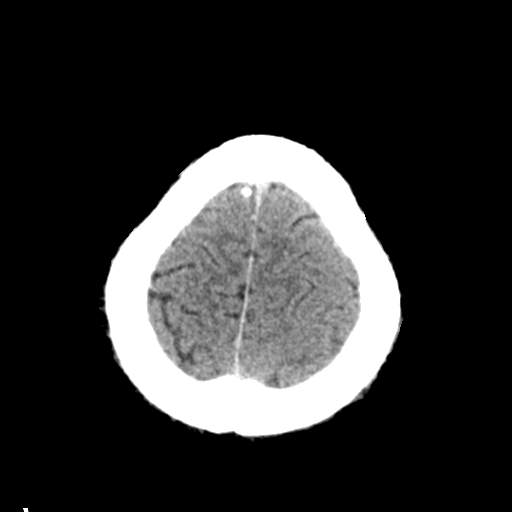
[im 26/32  brain]
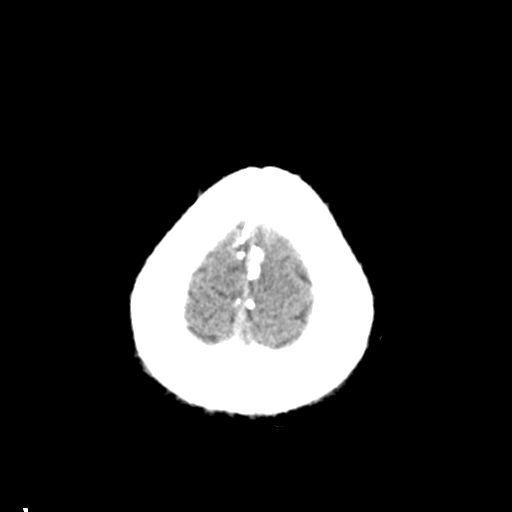
[im 26/32  bone]
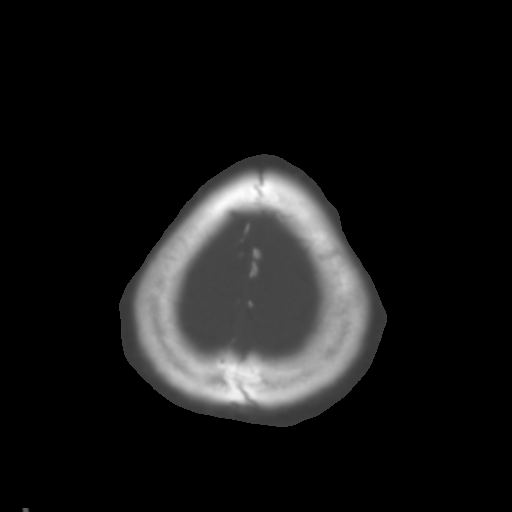
[im 29/32  brain]
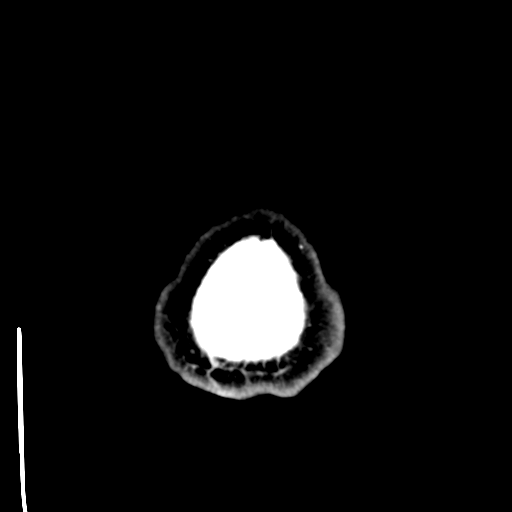

[Series 4: coronal soft tissue · coronal · 0.36mm/px · 3 of 70 slices shown]
[im 24/70  brain]
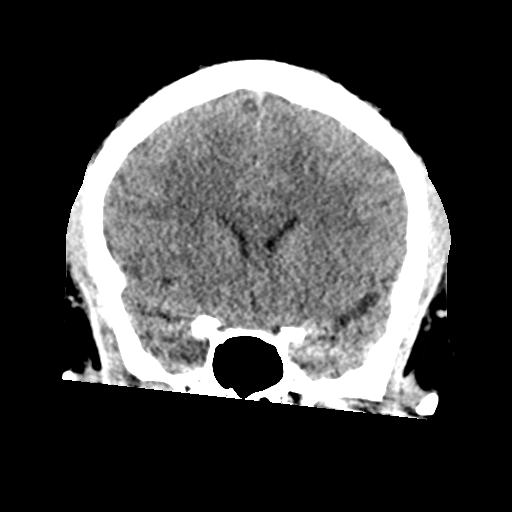
[im 31/70  brain]
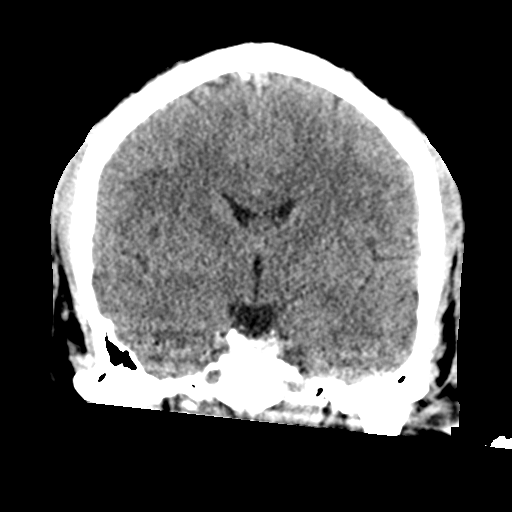
[im 39/70  brain]
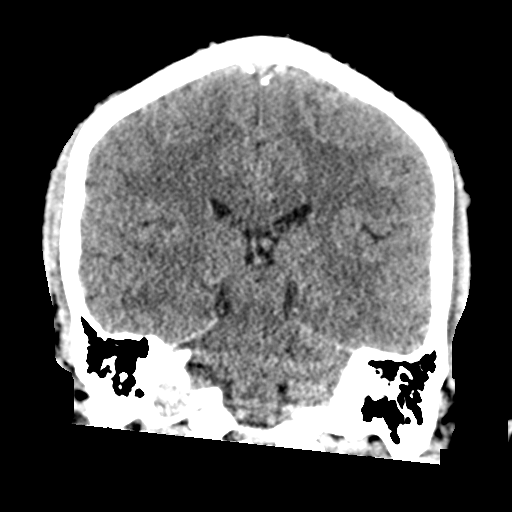

[Series 5: sagittal soft tissue · sagittal · 0.37mm/px · 3 of 55 slices shown]
[im 19/55  brain]
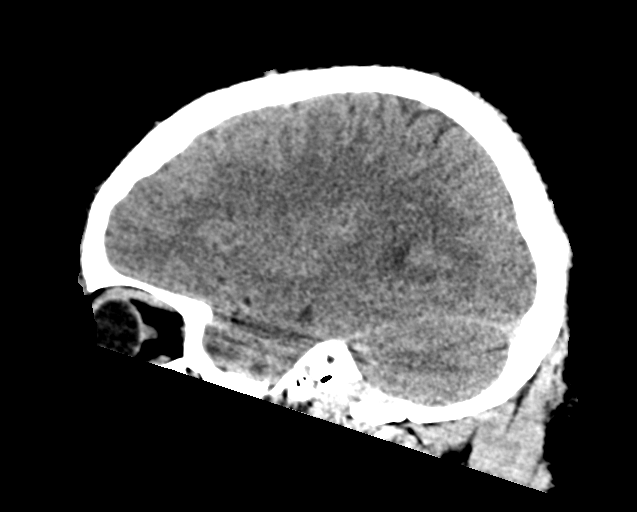
[im 28/55  brain]
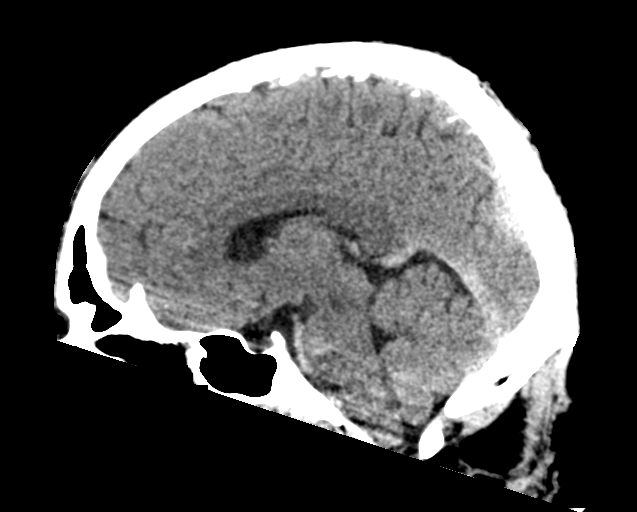
[im 37/55  brain]
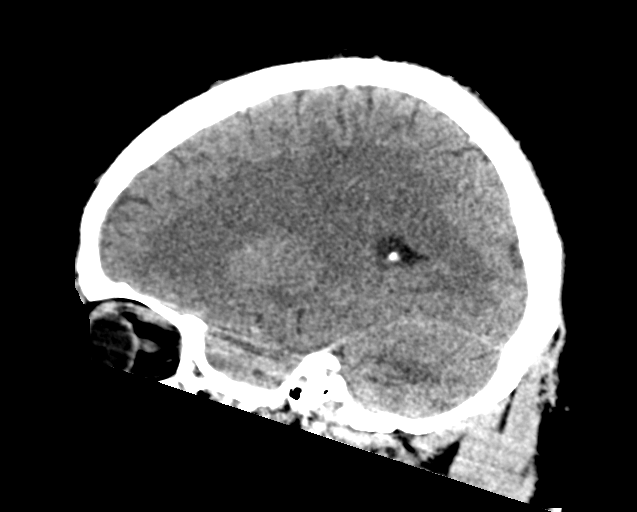

[16 of 47 positions shown; findings below may reference images not displayed]

FINDINGS: Brain:

Cerebral volume is normal.

There is no acute intracranial hemorrhage.

No demarcated cortical infarct.

No extra-axial fluid collection.

No evidence of intracranial mass.

No midline shift.

Vascular: No hyperdense vessel.

Skull: Normal. Negative for fracture or focal lesion.

Sinuses/Orbits: Visualized orbits show no acute finding. Mild
ethmoid sinus mucosal thickening at the imaged levels.
IMPRESSION: Unremarkable non-contrast CT appearance of the brain. No evidence of
acute intracranial abnormality.

Mild ethmoid sinus mucosal thickening.
